# Patient Record
Sex: Male | Born: 2005
Health system: Southern US, Community
[De-identification: ages and names within clinical notes are randomized; demographics above are authoritative.]

## PROBLEM LIST (undated history)

## (undated) DIAGNOSIS — Z91018 Allergy to other foods: Secondary | ICD-10-CM

## (undated) DIAGNOSIS — L309 Dermatitis, unspecified: Secondary | ICD-10-CM

## (undated) DIAGNOSIS — J309 Allergic rhinitis, unspecified: Secondary | ICD-10-CM

## (undated) HISTORY — DX: Dermatitis, unspecified: L30.9

## (undated) HISTORY — DX: Allergy to other foods: Z91.018

## (undated) HISTORY — DX: Allergic rhinitis, unspecified: J30.9

## (undated) HISTORY — PX: ADENOIDECTOMY: SUR15

---
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
## 2025-01-11 ENCOUNTER — Ambulatory Visit: Admitting: Allergy
